# Patient Record
Sex: Female | Born: 2000 | Race: Black or African American | Hispanic: No | Marital: Single | Smoking: Never smoker
Health system: Southern US, Community
[De-identification: ages and names within clinical notes are randomized; demographics above are authoritative.]

---
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
## 2021-06-29 IMAGING — US US EXTREM LOW VENOUS*L*
1 series · 14 of 24 positions shown · non-contrast
Comparison: None.

CLINICAL DATA: Left lower extremity pain and edema over the last
2-4 days.

EXAM:
Left LOWER EXTREMITY VENOUS DOPPLER ULTRASOUND
TECHNIQUE: Gray-scale sonography with compression, as well as color and duplex
ultrasound, were performed to evaluate the deep venous system(s)
from the level of the common femoral vein through the popliteal and
proximal calf veins.

[Series 1: us extrem low venous*left* · 14 of 31 slices shown]
[im 1/31]
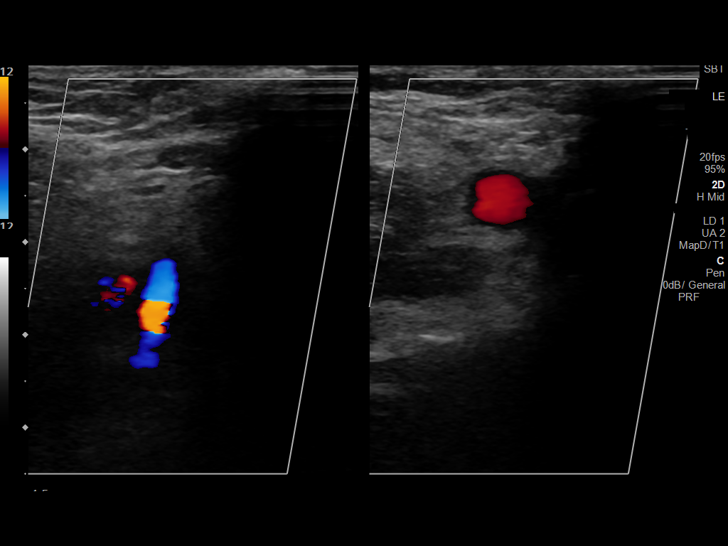
[im 3/31]
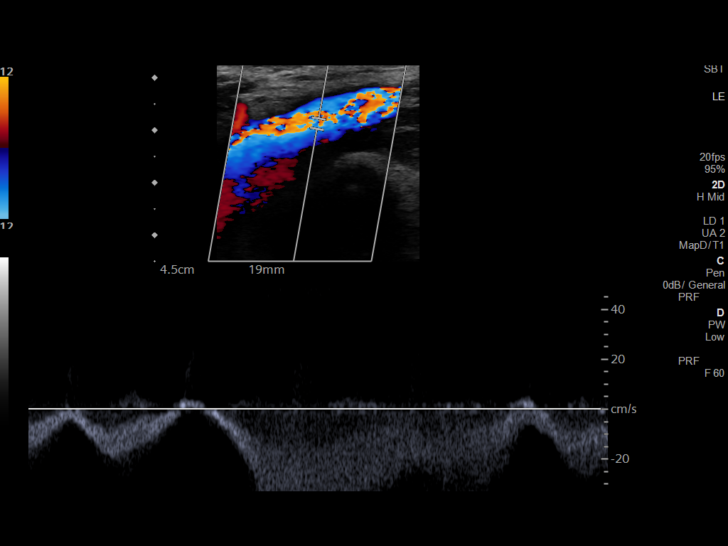
[im 6/31]
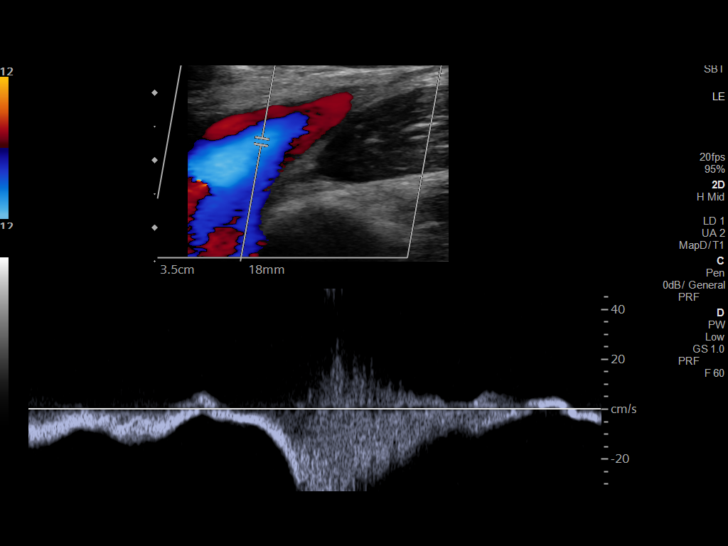
[im 8/31]
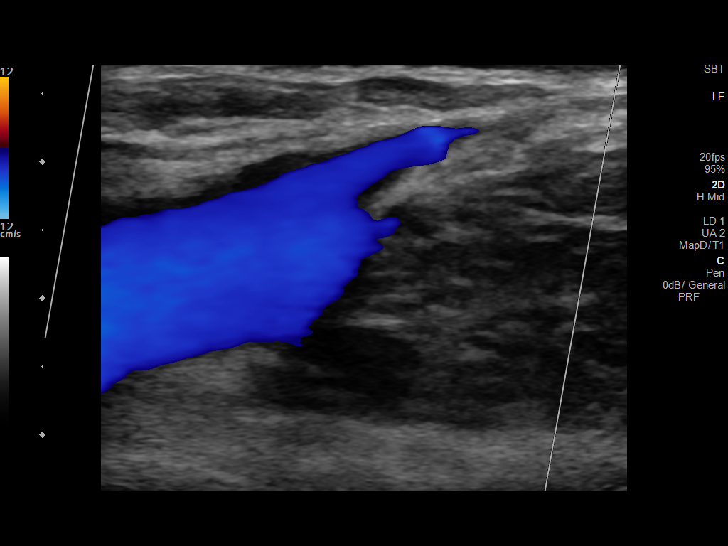
[im 10/31]
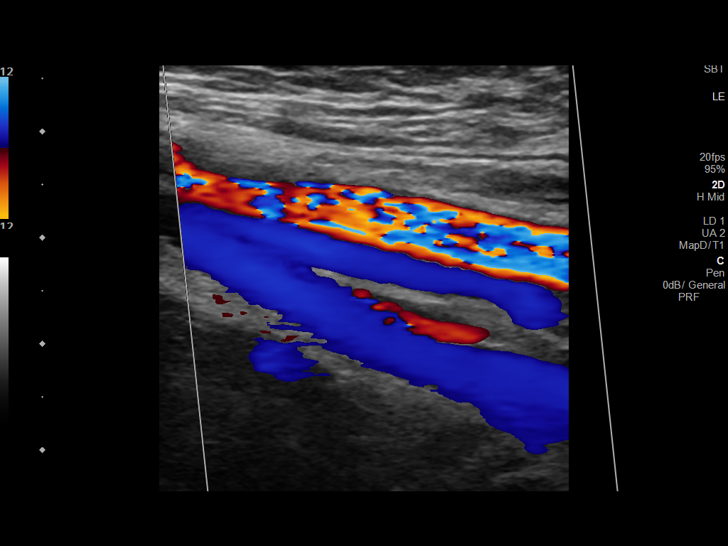
[im 12/31]
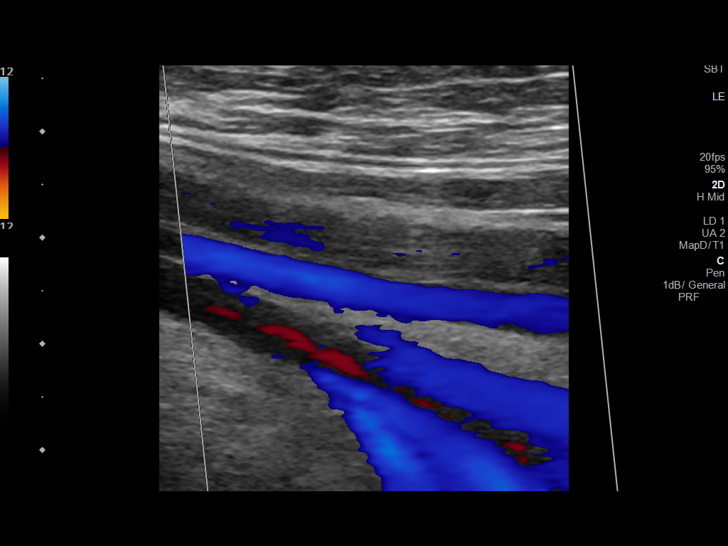
[im 15/31]
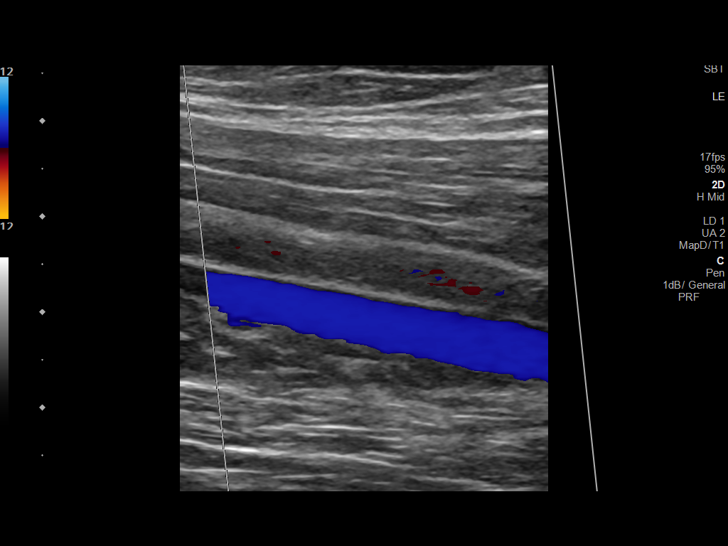
[im 16/31]
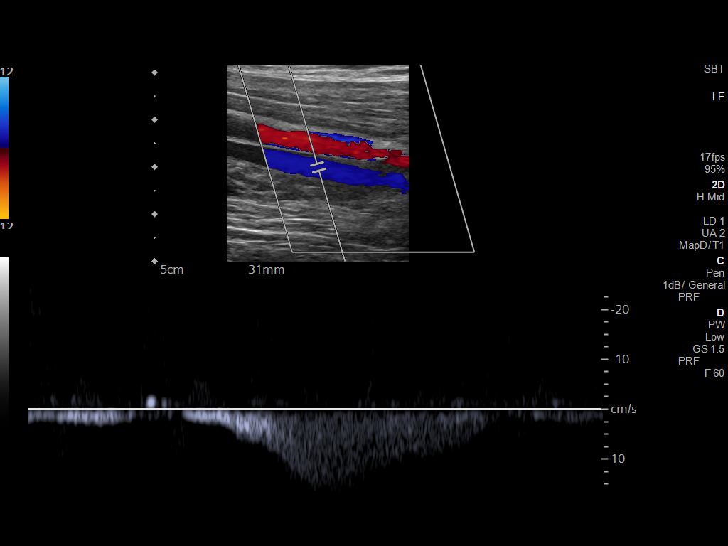
[im 19/31]
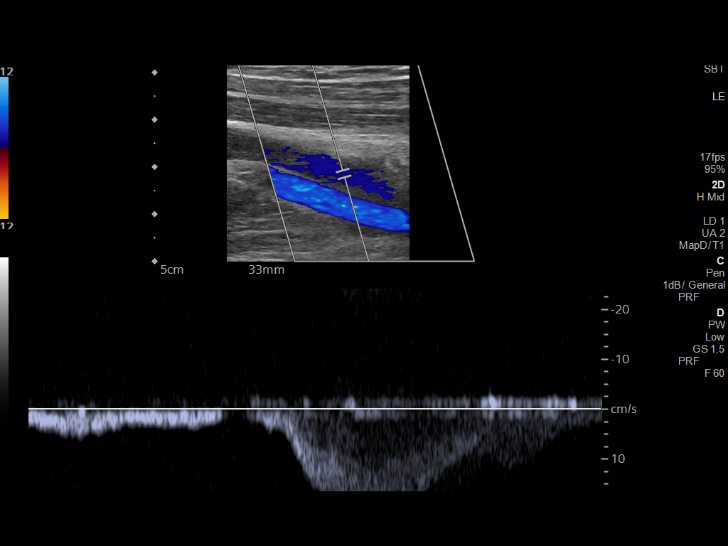
[im 21/31]
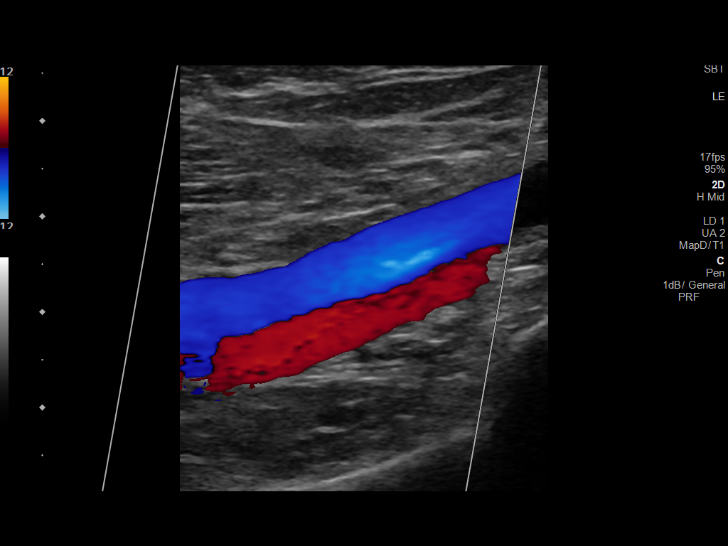
[im 24/31]
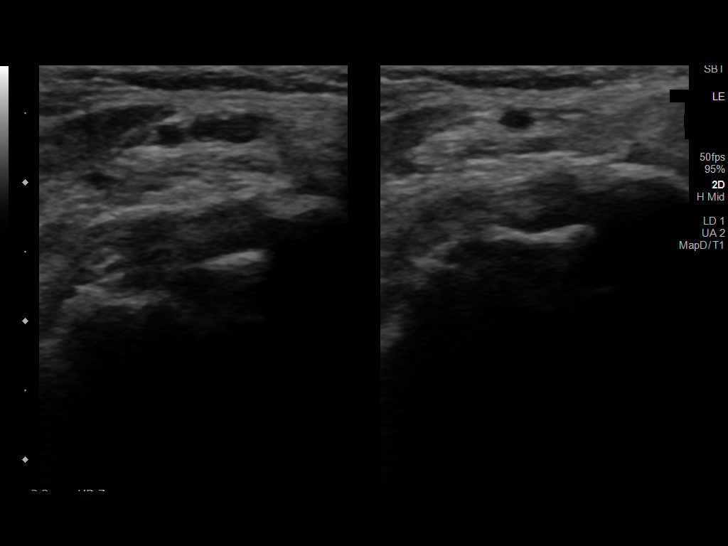
[im 25/31]
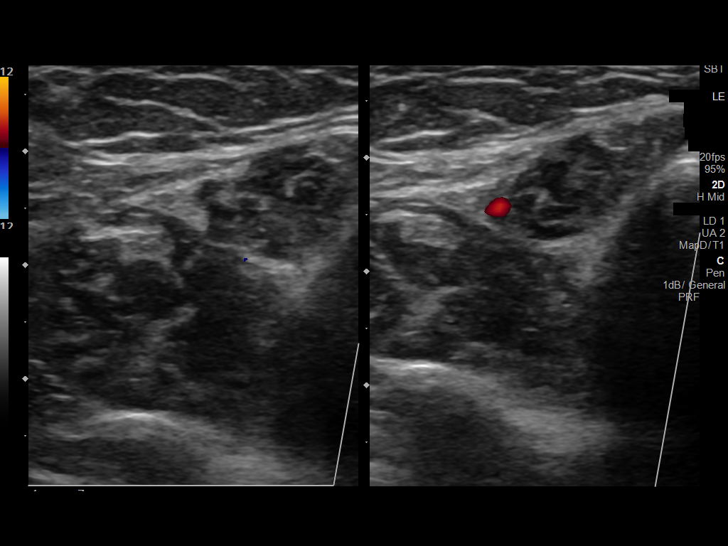
[im 28/31]
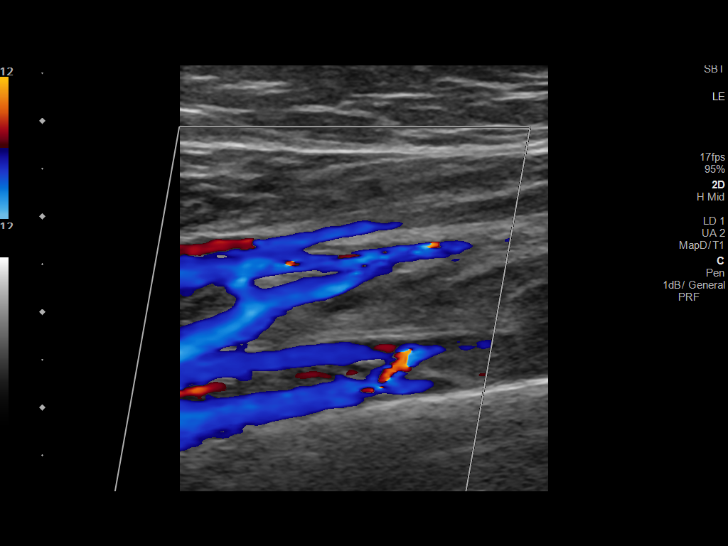
[im 31/31]
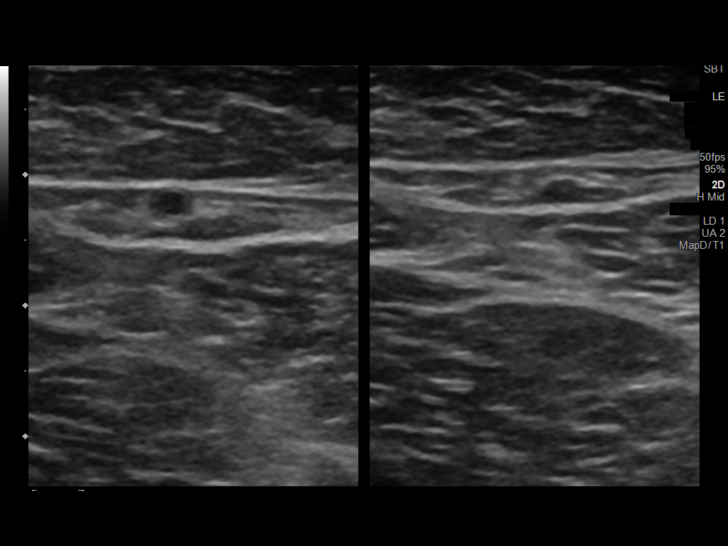

[14 of 24 positions shown; findings below may reference images not displayed]

FINDINGS: VENOUS

Normal compressibility of the common femoral, superficial femoral,
and popliteal veins, as well as the visualized calf veins.
Visualized portions of profunda femoral vein and great saphenous
vein unremarkable. No filling defects to suggest DVT on grayscale or
color Doppler imaging. Doppler waveforms show normal direction of
venous flow, normal respiratory phasicity and response to
augmentation.

Limited views of the contralateral common femoral vein are
unremarkable.

OTHER

None.

Limitations: none
IMPRESSION: No femoropopliteal DVT nor evidence of DVT within the visualized
calf veins.
